# Patient Record
Sex: Female | Born: 1995 | Race: Black or African American | Hispanic: No | Marital: Single | Smoking: Current every day smoker
Health system: Southern US, Community
[De-identification: ages and names within clinical notes are randomized; demographics above are authoritative.]

---

## 2018-07-07 ENCOUNTER — Other Ambulatory Visit: Payer: Self-pay

## 2018-07-07 ENCOUNTER — Encounter (HOSPITAL_COMMUNITY): Payer: Self-pay | Admitting: Emergency Medicine

## 2018-07-07 ENCOUNTER — Emergency Department (HOSPITAL_COMMUNITY)
Admission: EM | Admit: 2018-07-07 | Discharge: 2018-07-07 | Disposition: A | Discharge status: Home / Self Care | Payer: Self-pay | Attending: Emergency Medicine | Admitting: Emergency Medicine

## 2018-07-07 ENCOUNTER — Emergency Department (HOSPITAL_COMMUNITY): Payer: Self-pay

## 2018-07-07 DIAGNOSIS — Z20822 Contact with and (suspected) exposure to covid-19: Secondary | ICD-10-CM

## 2018-07-07 DIAGNOSIS — F17210 Nicotine dependence, cigarettes, uncomplicated: Secondary | ICD-10-CM | POA: Insufficient documentation

## 2018-07-07 DIAGNOSIS — N6320 Unspecified lump in the left breast, unspecified quadrant: Secondary | ICD-10-CM | POA: Insufficient documentation

## 2018-07-07 DIAGNOSIS — R112 Nausea with vomiting, unspecified: Secondary | ICD-10-CM | POA: Insufficient documentation

## 2018-07-07 DIAGNOSIS — Z20828 Contact with and (suspected) exposure to other viral communicable diseases: Secondary | ICD-10-CM | POA: Insufficient documentation

## 2018-07-07 DIAGNOSIS — N644 Mastodynia: Secondary | ICD-10-CM | POA: Insufficient documentation

## 2018-07-07 LAB — CBC WITH DIFFERENTIAL/PLATELET
Abs Immature Granulocytes: 0.01 10*3/uL (ref 0.00–0.07)
Basophils Absolute: 0 10*3/uL (ref 0.0–0.1)
Basophils Relative: 1 %
Eosinophils Absolute: 0.1 10*3/uL (ref 0.0–0.5)
Eosinophils Relative: 1 %
HCT: 34 % — ABNORMAL LOW (ref 36.0–46.0)
Hemoglobin: 10.9 g/dL — ABNORMAL LOW (ref 12.0–15.0)
Immature Granulocytes: 0 %
Lymphocytes Relative: 41 %
Lymphs Abs: 2.7 10*3/uL (ref 0.7–4.0)
MCH: 30.6 pg (ref 26.0–34.0)
MCHC: 32.1 g/dL (ref 30.0–36.0)
MCV: 95.5 fL (ref 80.0–100.0)
Monocytes Absolute: 0.4 10*3/uL (ref 0.1–1.0)
Monocytes Relative: 6 %
Neutro Abs: 3.3 10*3/uL (ref 1.7–7.7)
Neutrophils Relative %: 51 %
Platelets: 384 10*3/uL (ref 150–400)
RBC: 3.56 MIL/uL — ABNORMAL LOW (ref 3.87–5.11)
RDW: 12.2 % (ref 11.5–15.5)
WBC: 6.6 10*3/uL (ref 4.0–10.5)
nRBC: 0 % (ref 0.0–0.2)

## 2018-07-07 LAB — COMPREHENSIVE METABOLIC PANEL
ALT: 19 U/L (ref 0–44)
AST: 21 U/L (ref 15–41)
Albumin: 4.1 g/dL (ref 3.5–5.0)
Alkaline Phosphatase: 56 U/L (ref 38–126)
Anion gap: 10 (ref 5–15)
BUN: 12 mg/dL (ref 6–20)
CO2: 24 mmol/L (ref 22–32)
Calcium: 9.1 mg/dL (ref 8.9–10.3)
Chloride: 103 mmol/L (ref 98–111)
Creatinine, Ser: 0.66 mg/dL (ref 0.44–1.00)
GFR calc Af Amer: >60 mL/min (ref >60)
GFR calc non Af Amer: >60 mL/min (ref >60)
Glucose, Bld: 88 mg/dL (ref 70–99)
Potassium: 3.8 mmol/L (ref 3.5–5.1)
Sodium: 137 mmol/L (ref 135–145)
Total Bilirubin: 0.4 mg/dL (ref 0.3–1.2)
Total Protein: 7.5 g/dL (ref 6.5–8.1)

## 2018-07-07 LAB — URINALYSIS, ROUTINE W REFLEX MICROSCOPIC
Bilirubin Urine: NEGATIVE
Glucose, UA: NEGATIVE mg/dL
Hgb urine dipstick: NEGATIVE
Ketones, ur: NEGATIVE mg/dL
Leukocytes,Ua: NEGATIVE
Nitrite: NEGATIVE
Protein, ur: NEGATIVE mg/dL
Specific Gravity, Urine: 1.018 (ref 1.005–1.030)
pH: 7 (ref 5.0–8.0)

## 2018-07-07 LAB — I-STAT BETA HCG BLOOD, ED (MC, WL, AP ONLY): I-stat hCG, quantitative: <5 m[IU]/mL (ref <5)

## 2018-07-07 LAB — LIPASE, BLOOD: Lipase: 21 U/L (ref 11–51)

## 2018-07-07 MED ORDER — SODIUM CHLORIDE 0.9 % IV BOLUS
1000.0000 mL | Freq: Once | INTRAVENOUS | Status: AC
  Administered 2018-07-07: 1000 mL via INTRAVENOUS

## 2018-07-07 MED ORDER — ONDANSETRON HCL 4 MG/2ML IJ SOLN
4.0000 mg | Freq: Once | INTRAMUSCULAR | Status: AC
  Administered 2018-07-07: 14:00:00 4 mg via INTRAVENOUS
  Filled 2018-07-07: qty 2

## 2018-07-07 MED ORDER — FAMOTIDINE 20 MG PO TABS: 20.0000 mg | ORAL_TABLET | Freq: Two times a day (BID) | ORAL | 0 refills | Status: AC

## 2018-07-07 MED ORDER — KETOROLAC TROMETHAMINE 30 MG/ML IJ SOLN
30.0000 mg | Freq: Once | INTRAMUSCULAR | Status: AC
  Administered 2018-07-07: 14:00:00 30 mg via INTRAVENOUS
  Filled 2018-07-07: qty 1

## 2018-07-07 MED ORDER — ONDANSETRON 4 MG PO TBDP: ORAL_TABLET | ORAL | 0 refills | Status: AC

## 2018-07-07 NOTE — ED Notes (Signed)
Discharge instructions read to pt who verbalized understanding. Pt calm and cooperative.  All belongings returned to pt. Escorted pt to the exit and she called a cab.

## 2018-07-07 NOTE — ED Provider Notes (Signed)
[facility]-EMERGENCY DEPT Provider Note   CSN: [id number] Arrival date & time: 07/07/18  1006    History   Chief Complaint Chief Complaint  Patient presents with   Emesis    HPI Paige Rush Grullon is a 23 y.o. female.     Paige Rush Paige Rush is a 23 y.o. female who is otherwise healthy, presents to emergency department for evaluation of nausea and vomiting for 2 days.  She reports some associated generalized body aches and thinks she had a low-grade fever at work today.  Has had some diarrhea.  An occasional cough.  Patient also complains of breast tenderness.  She denies chest pain or shortness of breath.  She denies any lumps or bumps noted on the left breast, no nipple changes or discharge.  She denies abdominal pain associated with her nausea and vomiting but reports she feels achy all over, no dysuria or urinary frequency, no vaginal discharge or vaginal bleeding.  Patient reports that her period was little over a month ago, her cycle is often irregular, she is sexually active and does not use protection, wonders if she could be pregnant.  She has not taken any medications to treat her symptoms prior to arrival, was eating in the waiting room and has been able to keep this down, no other aggravating relieving factors.     History reviewed. No pertinent past medical history.  There are no active problems to display for this patient.   History reviewed. No pertinent surgical history.   OB History   No obstetric history on file.      Home Medications    Prior to Admission medications   Medication Sig Start Date End Date Taking? Authorizing Provider  famotidine (PEPCID) 20 MG tablet Take 1 tablet (20 mg total) by mouth 2 (two) times daily. 07/07/18   Dartha LodgeFord, Zayyan Mullen N, PA-C  ondansetron (ZOFRAN ODT) 4 MG disintegrating tablet 4mg  ODT q4 hours prn nausea/vomit 07/07/18   Dartha LodgeFord, Belladonna Lubinski N, PA-C    Family History No family history on file.  Social History Social  History   Tobacco Use   Smoking status: Current Every Day Smoker    Packs/day: 0.50    Types: Cigarettes  Substance Use Topics   Alcohol use: Never    Frequency: Never   Drug use: Yes    Types: Marijuana     Allergies   Patient has no known allergies.   Review of Systems Review of Systems  Constitutional: Positive for chills. Negative for fever.  HENT: Negative.   Respiratory: Positive for cough. Negative for chest tightness and shortness of breath.   Cardiovascular: Negative for chest pain and leg swelling.  Gastrointestinal: Positive for diarrhea, nausea and vomiting. Negative for abdominal pain and constipation.  Genitourinary: Negative for dysuria, frequency, vaginal bleeding and vaginal discharge.  Musculoskeletal: Positive for myalgias. Negative for arthralgias.  Skin: Negative for color change and rash.     Physical Exam Updated Vital Signs BP 119/75 (BP Location: Left Arm)    Pulse 60    Temp 98.9 F (37.2 C) (Oral)    Resp 15    Ht 5\' 7"  (1.702 m)    Wt 81.6 kg    LMP 05/26/2018 (Approximate)    SpO2 97%    BMI 28.19 kg/m   Physical Exam Vitals signs and nursing note reviewed.  Constitutional:      General: She is not in acute distress.    Appearance: Normal appearance. She is well-developed and normal weight. She  is not ill-appearing or diaphoretic.     Comments: Patient is well-appearing, laying comfortably in bed.  HENT:     Head: Normocephalic and atraumatic.     Mouth/Throat:     Mouth: Mucous membranes are moist.     Pharynx: Oropharynx is clear.  Eyes:     General:        Right eye: No discharge.        Left eye: No discharge.     Pupils: Pupils are equal, round, and reactive to light.  Neck:     Musculoskeletal: Neck supple.  Cardiovascular:     Rate and Rhythm: Normal rate and regular rhythm.     Heart sounds: Normal heart sounds. No murmur. No friction rub. No gallop.   Pulmonary:     Effort: Pulmonary effort is normal. No respiratory  distress.     Breath sounds: Normal breath sounds. No wheezing or rales.     Comments: Respirations equal and unlabored, patient able to speak in full sentences, lungs clear to auscultation bilaterally Chest:       Comments: Aside from cystic structure noted above, no palpable masses, no overlying skin changes, erythema or warmth, no nipple discharge. Abdominal:     General: Bowel sounds are normal. There is no distension.     Palpations: Abdomen is soft. There is no mass.     Tenderness: There is no abdominal tenderness. There is no guarding.     Comments: Abdomen soft, nondistended, nontender to palpation in all quadrants without guarding or peritoneal signs  Musculoskeletal:        General: No deformity.  Skin:    General: Skin is warm and dry.     Capillary Refill: Capillary refill takes less than 2 seconds.  Neurological:     Mental Status: She is alert.     Coordination: Coordination normal.     Comments: Speech is clear, able to follow commands Moves extremities without ataxia, coordination intact  Psychiatric:        Mood and Affect: Mood normal.        Behavior: Behavior normal.      ED Treatments / Results  Labs (all labs ordered are listed, but only abnormal results are displayed) Labs Reviewed  CBC WITH DIFFERENTIAL/PLATELET - Abnormal; Notable for the following components:      Result Value   RBC 3.56 (*)    Hemoglobin 10.9 (*)    HCT 34.0 (*)    All other components within normal limits  NOVEL CORONAVIRUS, NAA (HOSPITAL ORDER, SEND-OUT TO REF LAB)  COMPREHENSIVE METABOLIC PANEL  LIPASE, BLOOD  URINALYSIS, ROUTINE W REFLEX MICROSCOPIC  I-STAT BETA HCG BLOOD, ED ([facility], [facility], [facility] ONLY)    EKG EKG Interpretation  Date/Time:  Wednesday July 07 2018 13:46:57 EDT Ventricular Rate:  71 PR Interval:    QRS Duration: 90 QT Interval:  426 QTC Calculation: 463 R Axis:   74 Text Interpretation:  Sinus rhythm No prior for comparison.  No STEMI  Confirmed by Nanda Quinton [id number]) on 07/07/2018 2:17:27 PM   Radiology Dg Chest Port 1 View  Result Date: 07/07/2018 CLINICAL DATA:  Weakness and confusion. EXAM: PORTABLE CHEST 1 VIEW COMPARISON:  None. FINDINGS: 1503 hours. Two views obtained. The heart size and mediastinal contours are normal. The lungs are clear. There is no pleural effusion or pneumothorax. No acute osseous findings are identified. Telemetry leads overlie the chest. IMPRESSION: No active cardiopulmonary process. Electronically Signed   By: Richardean Sale  M.D.   On: 07/07/2018 16:08    Procedures Procedures (including critical care time)  Medications Ordered in ED Medications  sodium chloride 0.9 % bolus 1,000 mL (0 mLs Intravenous Stopped 07/07/18 1713)  ondansetron (ZOFRAN) injection 4 mg (4 mg Intravenous Given 07/07/18 1339)  ketorolac (TORADOL) 30 MG/ML injection 30 mg (30 mg Intravenous Given 07/07/18 1341)     Initial Impression / Assessment and Plan / ED Course  I have reviewed the triage vital signs and the nursing notes.  Pertinent labs & imaging results that were available during my care of the patient were reviewed by me and considered in my medical decision making (see chart for details).  Patient presents for evaluation of nausea, vomiting, generalized body aches and breast tenderness.  She felt like she had a low-grade fever at work today.  Unsure of sick contacts.  No focal abdominal pain on exam.  Patient complaining of breast tenderness, small palpable cystic structure within the left breast, no other palpable masses, no skin changes or signs of abscess or mastitis.  Will have patient follow-up at the breast center for imaging.  Remainder of patient's exam is reassuring, lungs are clear, she has not had any active vomiting and she appears well.  Will check basic labs, given low-grade fever, patient simply requested that she be tested for the coronavirus, I feel this is reasonable, will send 48-hour test.  We will also check  chest x-ray and EKG and breast and chest tenderness.  Labs very reassuring negative pregnancy, no leukocytosis, hemoglobin of 10.9, no acute electrolyte derangements, normal renal liver function, normal lipase.  Urinalysis without any signs of infection.  Chest x-ray is clear with no evidence of infiltrate or other acute abnormality.  EKG shows normal sinus rhythm without concerning changes.  Patient has been treated with Zofran and Toradol and IV fluids, she is tolerating p.o. with no further vomiting, suspect viral bug as cause for patient's symptoms, currently test pending, discussed quarantine at home until results have returned.  Patient expresses understanding and agreement with plan.  Discharged home in good condition.  Final Clinical Impressions(s) / ED Diagnoses   Final diagnoses:  Non-intractable vomiting with nausea, unspecified vomiting type  Breast tenderness  Suspected Covid-19 Virus Infection  Breast mass, left    ED Discharge Orders         Ordered    ondansetron (ZOFRAN ODT) 4 MG disintegrating tablet     07/07/18 1657    famotidine (PEPCID) 20 MG tablet  2 times daily     07/07/18 1657           Dartha LodgeFord, Esker Dever N, [place]PA-C 07/10/18 1128    Maia PlanLong, Joshua G, MD 07/12/18 [phone]

## 2018-07-07 NOTE — ED Notes (Signed)
Bed: WA07 Expected date:  Expected time:  Means of arrival:  Comments: 

## 2018-07-07 NOTE — ED Notes (Signed)
Bed: WTR9 Expected date:  Expected time:  Means of arrival:  Comments: 

## 2018-07-07 NOTE — ED Notes (Signed)
RN was attempting to finish triaging patient and she demanded to speak to this RN's director. Pt is calling staff "b words" and asking again to speak to the director. ED ADON made aware of patient request. Unable to finish triage due to patient's demands

## 2018-07-07 NOTE — ED Triage Notes (Signed)
Pt reports nausea and vomiting for 2 days.  Generalized body ache.  Per report she ate some cookies in the waiting room without vomiting.  She had some Tylenol from her employer prior to arrival. It has not helped her body ache.

## 2018-07-07 NOTE — ED Notes (Signed)
EKG given to Dr. Butler 

## 2018-07-07 NOTE — Discharge Instructions (Addendum)
Your work-up today has been very reassuring, pregnancy test was negative and labs overall look good, chest x-ray was clear.  You were tested for the coronavirus today and you will be called in about 2 days with the results of your test, if negative you can return to work.  Your symptoms may also be caused by foodborne illness, use Zofran as needed for nausea and Pepcid once daily before breakfast to help settle your stomach.  Make sure you are getting plenty of fluids.

## 2018-07-07 NOTE — ED Notes (Signed)
Pt is upset in the lobby because pt sts " its taking to long and I am in pain. You are only taking white people back and you are making me wait just because I am black." pt advised of the triage process, but pt is still upset.

## 2018-07-07 NOTE — ED Notes (Signed)
This Probation officer was asked to speak to patient regarding behavior and patient concerns.  Pt upset that she had been "skipped over" by EMT while she was on the phone and perceived EMT's behavior as rude.  This Probation officer explained the triage process and promised to speak to staff.  Pt was very understanding and her behavior was appropriate with this Probation officer.

## 2018-07-08 LAB — NOVEL CORONAVIRUS, NAA (HOSP ORDER, SEND-OUT TO REF LAB; TAT 18-24 HRS): SARS-CoV-2, NAA: NOT DETECTED

## 2020-02-23 IMAGING — DX PORTABLE CHEST - 1 VIEW
1 series · 2 of 2 positions shown · non-contrast
Comparison: None.

CLINICAL DATA: Weakness and confusion.

EXAM:
PORTABLE CHEST 1 VIEW

[Series 1: chest ap · 0.14mm/px · 2 of 2 slices shown]
[im 1/2]
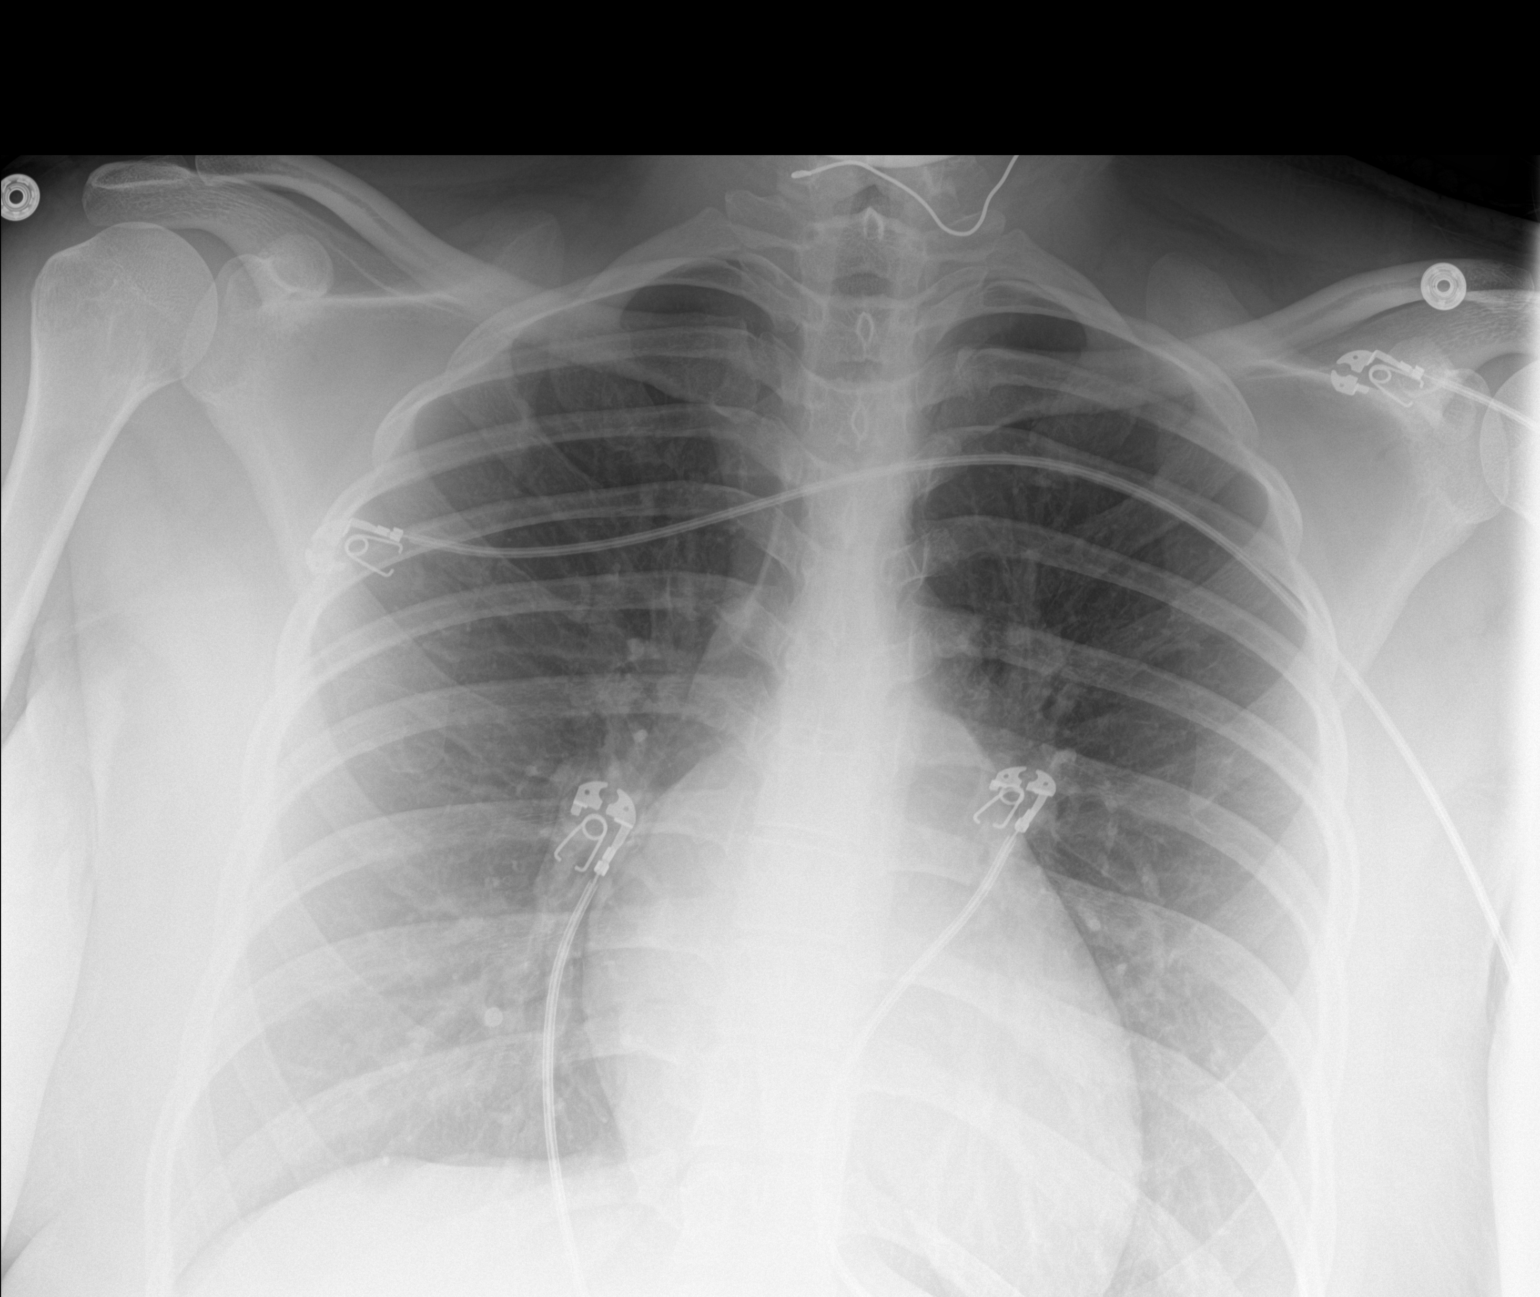
[im 2/2]
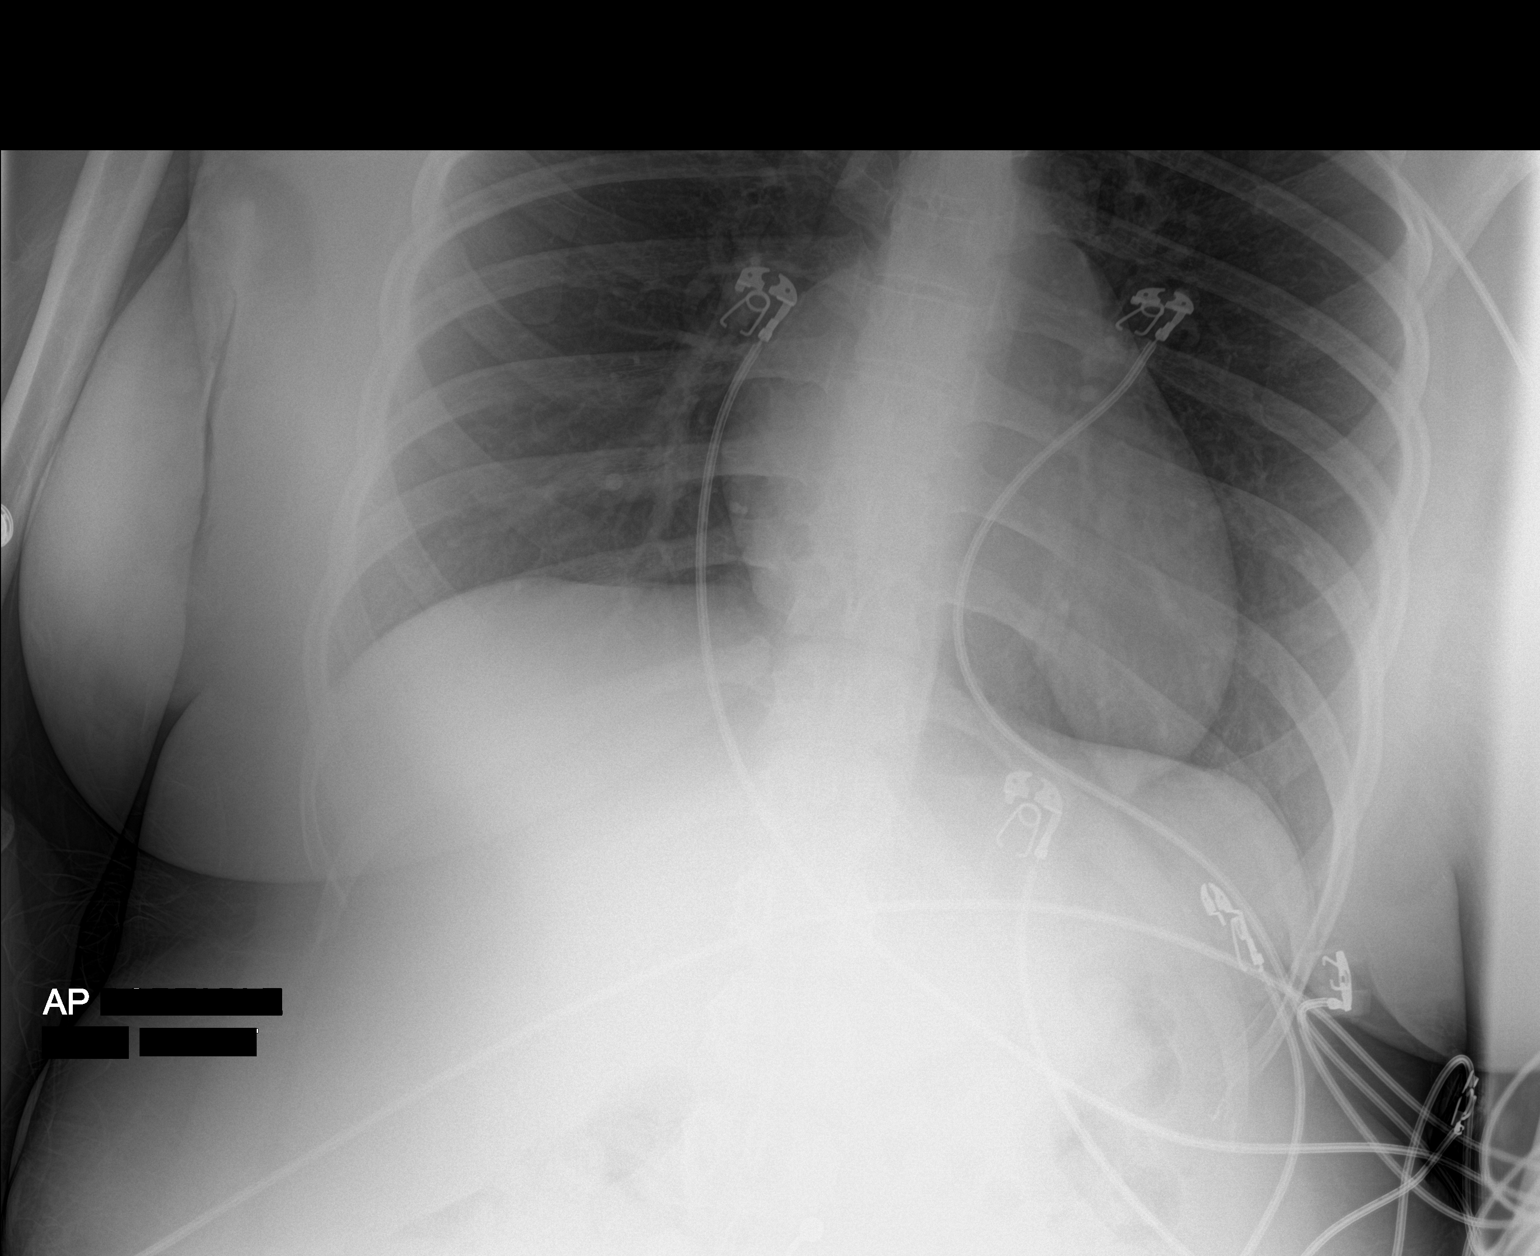

[2 of 2 positions shown; findings below may reference images not displayed]

FINDINGS: 1554 hours. Two views obtained. The heart size and mediastinal
contours are normal. The lungs are clear. There is no pleural
effusion or pneumothorax. No acute osseous findings are identified.
Telemetry leads overlie the chest.
IMPRESSION: No active cardiopulmonary process.

## 2022-05-27 ENCOUNTER — Emergency Department (HOSPITAL_COMMUNITY): Payer: Self-pay

## 2022-05-27 ENCOUNTER — Emergency Department (HOSPITAL_COMMUNITY)
Admission: EM | Admit: 2022-05-27 | Discharge: 2022-05-27 | Disposition: A | Discharge status: Home / Self Care | Payer: Self-pay | Attending: Emergency Medicine | Admitting: Emergency Medicine

## 2022-05-27 ENCOUNTER — Encounter (HOSPITAL_COMMUNITY): Payer: Self-pay

## 2022-05-27 DIAGNOSIS — Z3A00 Weeks of gestation of pregnancy not specified: Secondary | ICD-10-CM | POA: Insufficient documentation

## 2022-05-27 DIAGNOSIS — O4690 Antepartum hemorrhage, unspecified, unspecified trimester: Secondary | ICD-10-CM

## 2022-05-27 DIAGNOSIS — O209 Hemorrhage in early pregnancy, unspecified: Secondary | ICD-10-CM | POA: Insufficient documentation

## 2022-05-27 LAB — CBC WITH DIFFERENTIAL/PLATELET
Abs Immature Granulocytes: 0.01 10*3/uL (ref 0.00–0.07)
Basophils Absolute: 0.1 10*3/uL (ref 0.0–0.1)
Basophils Relative: 1 %
Eosinophils Absolute: 0.1 10*3/uL (ref 0.0–0.5)
Eosinophils Relative: 1 %
HCT: 34.8 % — ABNORMAL LOW (ref 36.0–46.0)
Hemoglobin: 11.3 g/dL — ABNORMAL LOW (ref 12.0–15.0)
Immature Granulocytes: 0 %
Lymphocytes Relative: 41 %
Lymphs Abs: 2.2 10*3/uL (ref 0.7–4.0)
MCH: 29.3 pg (ref 26.0–34.0)
MCHC: 32.5 g/dL (ref 30.0–36.0)
MCV: 90.2 fL (ref 80.0–100.0)
Monocytes Absolute: 0.2 10*3/uL (ref 0.1–1.0)
Monocytes Relative: 4 %
Neutro Abs: 2.9 10*3/uL (ref 1.7–7.7)
Neutrophils Relative %: 53 %
Platelets: 349 10*3/uL (ref 150–400)
RBC: 3.86 MIL/uL — ABNORMAL LOW (ref 3.87–5.11)
RDW: 13.7 % (ref 11.5–15.5)
WBC: 5.4 10*3/uL (ref 4.0–10.5)
nRBC: 0 % (ref 0.0–0.2)

## 2022-05-27 LAB — URINALYSIS, ROUTINE W REFLEX MICROSCOPIC
Bilirubin Urine: NEGATIVE
Glucose, UA: NEGATIVE mg/dL
Ketones, ur: NEGATIVE mg/dL
Leukocytes,Ua: NEGATIVE
Nitrite: NEGATIVE
Protein, ur: 30 mg/dL — AB
Specific Gravity, Urine: 1.032 — ABNORMAL HIGH (ref 1.005–1.030)
pH: 5 (ref 5.0–8.0)

## 2022-05-27 LAB — HCG, QUANTITATIVE, PREGNANCY: hCG, Beta Chain, Quant, S: 227 m[IU]/mL — ABNORMAL HIGH (ref <5)

## 2022-05-27 LAB — BASIC METABOLIC PANEL
Anion gap: 7 (ref 5–15)
BUN: 9 mg/dL (ref 6–20)
CO2: 23 mmol/L (ref 22–32)
Calcium: 9.1 mg/dL (ref 8.9–10.3)
Chloride: 105 mmol/L (ref 98–111)
Creatinine, Ser: 0.83 mg/dL (ref 0.44–1.00)
GFR, Estimated: >60 mL/min (ref >60)
Glucose, Bld: 99 mg/dL (ref 70–99)
Potassium: 3.5 mmol/L (ref 3.5–5.1)
Sodium: 135 mmol/L (ref 135–145)

## 2022-05-27 LAB — PREGNANCY, URINE: Preg Test, Ur: POSITIVE — AB

## 2022-05-27 MED ORDER — ACETAMINOPHEN 500 MG PO TABS
1000.0000 mg | ORAL_TABLET | Freq: Once | ORAL | Status: AC
  Administered 2022-05-27: 1000 mg via ORAL
  Filled 2022-05-27: qty 2

## 2022-05-27 MED ORDER — OXYCODONE HCL 5 MG PO TABS
5.0000 mg | ORAL_TABLET | Freq: Once | ORAL | Status: AC
  Administered 2022-05-27: 5 mg via ORAL
  Filled 2022-05-27: qty 1

## 2022-05-27 MED ORDER — LORAZEPAM 1 MG PO TABS
1.0000 mg | ORAL_TABLET | Freq: Once | ORAL | Status: AC
  Administered 2022-05-27: 1 mg via ORAL
  Filled 2022-05-27: qty 1

## 2022-05-27 NOTE — ED Notes (Signed)
Called lab to add HCG. MD states pt does not need to wait for results

## 2022-05-27 NOTE — ED Triage Notes (Signed)
C/o what she thought was heavy period for one week. 5 pads within 2 ours. Admits to brown and bright red blood. N/V last week mild discomfort. Admits She Google her symptoms and said come to ER". Admits she had miscarriage last year.

## 2022-05-27 NOTE — Discharge Instructions (Signed)
The ultrasound did not show any obvious viable pregnancy.  Please follow-up with OB/GYN in the office.  Typically if we do not see an obvious intrauterine pregnancy we have you return in 48 hours for recheck.

## 2022-05-27 NOTE — ED Provider Notes (Signed)
EMERGENCY DEPARTMENT AT [facility] Provider Note   CSN: [id number] Arrival date & time: 05/27/22  5956     History  Chief Complaint  Patient presents with   Vaginal Bleeding    Paige Rush is a 27 y.o. female.  27 yo F with a chief complaints of vaginal bleeding.  Started with less than her normal menstrual cycle was more like spotting going on for a couple weeks and then started having more heavy bleeding over the past couple days.  She has had some cramping that is consistent with her prior menstrual cycles.  She had some diffuse abdominal comfort and has vomited a couple times.  She denies any fevers.  Denies urinary symptoms.  She miscarried last year at 37 weeks.  Otherwise denies prior pregnancy.  She is sexually active and does not use protection and so thinks that she could be pregnant.   Vaginal Bleeding      Home Medications Prior to Admission medications   Medication Sig Start Date End Date Taking? Authorizing Provider  famotidine (PEPCID) 20 MG tablet Take 1 tablet (20 mg total) by mouth 2 (two) times daily. 07/07/18   Dartha Lodge, PA-C  ondansetron (ZOFRAN ODT) 4 MG disintegrating tablet 4mg  ODT q4 hours prn nausea/vomit 07/07/18   Dartha Lodge, PA-C      Allergies    Patient has no known allergies.    Review of Systems   Review of Systems  Genitourinary:  Positive for vaginal bleeding.    Physical Exam Updated Vital Signs BP (!) 136/49 (BP Location: Right Arm)   Pulse 84   Temp 98.3 F (36.8 C) (Oral)   Resp 19   LMP 04/14/2022 (Approximate)   SpO2 100%  Physical Exam Vitals and nursing note reviewed.  Constitutional:      General: She is not in acute distress.    Appearance: She is well-developed. She is not diaphoretic.  HENT:     Head: Normocephalic and atraumatic.  Eyes:     Pupils: Pupils are equal, round, and reactive to light.  Cardiovascular:     Rate and Rhythm: Normal rate and regular rhythm.     Heart  sounds: No murmur heard.    No friction rub. No gallop.  Pulmonary:     Effort: Pulmonary effort is normal.     Breath sounds: No wheezing or rales.  Abdominal:     General: There is no distension.     Palpations: Abdomen is soft.     Tenderness: There is abdominal tenderness.     Comments: Mild diffuse abdominal discomfort.   Musculoskeletal:        General: No tenderness.     Cervical back: Normal range of motion and neck supple.  Skin:    General: Skin is warm and dry.  Neurological:     Mental Status: She is alert and oriented to person, place, and time.  Psychiatric:        Behavior: Behavior normal.     ED Results / Procedures / Treatments   Labs (all labs ordered are listed, but only abnormal results are displayed) Labs Reviewed  URINALYSIS, ROUTINE W REFLEX MICROSCOPIC - Abnormal; Notable for the following components:      Result Value   Specific Gravity, Urine 1.032 (*)    Hgb urine dipstick SMALL (*)    Protein, ur 30 (*)    Bacteria, UA RARE (*)    All other components within normal limits  PREGNANCY, URINE - Abnormal; Notable for the following components:   Preg Test, Ur POSITIVE (*)    All other components within normal limits  CBC WITH DIFFERENTIAL/PLATELET - Abnormal; Notable for the following components:   RBC 3.86 (*)    Hemoglobin 11.3 (*)    HCT 34.8 (*)    All other components within normal limits  BASIC METABOLIC PANEL  HCG, QUANTITATIVE, PREGNANCY  POC URINE PREG, ED    EKG None  Radiology US OB Comp < 14 Wks  Result Date: 05/27/2022 CLINICAL DATA:  Possible ectopic pregnancy.  Vaginal bleeding. EXAM: OBSTETRIC <14 WK [place] AND TRANSVAGINAL OB US TECHNIQUE: Both transabdominal and transvaginal ultrasound examinations were performed for complete evaluation of the gestation as well as the maternal uterus, adnexal regions, and pelvic cul-de-sac. Transvaginal technique was performed to assess early pregnancy. COMPARISON:  None Available. FINDINGS:  Intrauterine gestational sac: None Maternal uterus/adnexae: Endometrial thickness of 8 mm. Unremarkable appearance of the ovaries aside from a corpus luteum cyst on the left. No adnexal mass identified. Small volume pelvic free fluid. IMPRESSION: No intrauterine pregnancy or findings suspicious for ectopic pregnancy. Findings are consistent with pregnancy of unknown location and may reflect early intrauterine pregnancy not yet visualized sonographically, occult ectopic pregnancy, or failed pregnancy. Electronically Signed   By: Sebastian Ache M.D.   On: 05/27/2022 12:58    Procedures Procedures    Medications Ordered in ED Medications  acetaminophen (TYLENOL) tablet 1,000 mg (1,000 mg Oral Given 05/27/22 1148)  oxyCODONE (Oxy IR/ROXICODONE) immediate release tablet 5 mg (5 mg Oral Given 05/27/22 1148)  LORazepam (ATIVAN) tablet 1 mg (1 mg Oral Given 05/27/22 1148)    ED Course/ Medical Decision Making/ A&P                             Medical Decision Making Amount and/or Complexity of Data Reviewed Labs: ordered. Radiology: ordered.  Risk OTC drugs. Prescription drug management.   27 yo F with a chief complaint of vaginal bleeding.  Going on for a couple weeks.  Will obtain a pregnancy test UA.  Pregnancy test is positive.  UA negative for infection on my independent interpretation.  Pelvic ultrasound with no identified IUP.  No obvious ectopic pregnancy.  Will have her follow-up in 48 hours either here or at the MAU.  CBC without significant anemia.  No significant electrolyte abnormality.  1:36 PM:  I have discussed the diagnosis/risks/treatment options with the patient.  Evaluation and diagnostic testing in the emergency department does not suggest an emergent condition requiring admission or immediate intervention beyond what has been performed at this time.  They will follow up with OB. We also discussed returning to the ED immediately if new or worsening sx occur. We discussed the sx  which are most concerning (e.g., sudden worsening pain, fever, inability to tolerate by mouth, worsening bleeding, syncope, worsening pain) that necessitate immediate return. Medications administered to the patient during their visit and any new prescriptions provided to the patient are listed below.  Medications given during this visit Medications  acetaminophen (TYLENOL) tablet 1,000 mg (1,000 mg Oral Given 05/27/22 1148)  oxyCODONE (Oxy IR/ROXICODONE) immediate release tablet 5 mg (5 mg Oral Given 05/27/22 1148)  LORazepam (ATIVAN) tablet 1 mg (1 mg Oral Given 05/27/22 1148)     The patient appears reasonably screen and/or stabilized for discharge and I doubt any other medical condition or other [facility] requiring further screening, evaluation, or  treatment in the ED at this time prior to discharge.          Final Clinical Impression(s) / ED Diagnoses Final diagnoses:  Vaginal bleeding in pregnancy    Rx / DC Orders ED Discharge Orders     None         Melene Plan, DO 05/27/22 1336

## 2022-05-30 ENCOUNTER — Inpatient Hospital Stay (HOSPITAL_COMMUNITY)
Admission: AD | Admit: 2022-05-30 | Discharge: 2022-05-30 | Discharge status: Against Medical Advice | Payer: Self-pay | Attending: Obstetrics and Gynecology | Admitting: Obstetrics and Gynecology

## 2022-05-30 ENCOUNTER — Encounter (HOSPITAL_COMMUNITY): Payer: Self-pay | Admitting: *Deleted

## 2022-05-30 DIAGNOSIS — O209 Hemorrhage in early pregnancy, unspecified: Secondary | ICD-10-CM | POA: Insufficient documentation

## 2022-05-30 DIAGNOSIS — Z5321 Procedure and treatment not carried out due to patient leaving prior to being seen by health care provider: Secondary | ICD-10-CM | POA: Insufficient documentation

## 2022-05-30 DIAGNOSIS — Z3A01 Less than 8 weeks gestation of pregnancy: Secondary | ICD-10-CM

## 2022-05-30 LAB — CBC
HCT: 34.5 % — ABNORMAL LOW (ref 36.0–46.0)
Hemoglobin: 11.3 g/dL — ABNORMAL LOW (ref 12.0–15.0)
MCH: 28.9 pg (ref 26.0–34.0)
MCHC: 32.8 g/dL (ref 30.0–36.0)
MCV: 88.2 fL (ref 80.0–100.0)
Platelets: 364 10*3/uL (ref 150–400)
RBC: 3.91 MIL/uL (ref 3.87–5.11)
RDW: 13.4 % (ref 11.5–15.5)
WBC: 5.2 10*3/uL (ref 4.0–10.5)
nRBC: 0 % (ref 0.0–0.2)

## 2022-05-30 LAB — ABO/RH: ABO/RH(D): O POS

## 2022-05-30 LAB — HCG, QUANTITATIVE, PREGNANCY: hCG, Beta Chain, Quant, S: 197 m[IU]/mL — ABNORMAL HIGH (ref <5)

## 2022-05-30 NOTE — MAU Note (Signed)
Pt tired of waiting stated she is going home and will see another doctor. Notified provider. Pt walked out before she could sign AMA form

## 2022-05-30 NOTE — MAU Provider Note (Signed)
History     CSN: [id number]  Arrival date and time: 05/30/22 1358   Event Date/Time   First Provider Initiated Contact with Patient 05/30/22 1523      Chief Complaint  Patient presents with   Vaginal Bleeding   HPI Paige Rush is a 27 y.o. G1P0 at [redacted]w[redacted]d by LMP. She presents to MAU with chief complaint of scant vaginal spotting on 05/27/2022. She voices to Olegario Messier, Consulting civil engineer and to [employer] that she is frustrated because she was seen in the ED for a pregnancy concern but did not receive an intelligible diagnosis and and was told to followup with MAU. She denies abdominal pain, heavy vaginal bleeding, dysuria, fever or recent illness.  OB History     Gravida  1   Para      Term      Preterm      AB      Living         SAB      IAB      Ectopic      Multiple      Live Births              No past medical history on file.  No past surgical history on file.  No family history on file.  Social History   Tobacco Use   Smoking status: Every Day    Packs/day: .5    Types: Cigarettes  Substance Use Topics   Alcohol use: Never   Drug use: Yes    Types: Marijuana    Allergies: No Known Allergies  Medications Prior to Admission  Medication Sig Dispense Refill Last Dose   famotidine (PEPCID) 20 MG tablet Take 1 tablet (20 mg total) by mouth 2 (two) times daily. 30 tablet 0    ondansetron (ZOFRAN ODT) 4 MG disintegrating tablet 4mg  ODT q4 hours prn nausea/vomit 10 tablet 0     Review of Systems  Genitourinary:  Positive for vaginal bleeding.  All other systems reviewed and are negative.  Physical Exam   Blood pressure 127/85, pulse 66, temperature 98.3 F (36.8 C), resp. rate 18, height 5\' 9"  (1.753 m), weight 77.6 kg, last menstrual period 04/14/2022.  Physical Exam Vitals and nursing note reviewed.  Constitutional:      General: She is not in acute distress.    Appearance: Normal appearance. She is not ill-appearing.  Cardiovascular:     Rate and  Rhythm: Normal rate.  Pulmonary:     Effort: Pulmonary effort is normal.  Abdominal:     General: Abdomen is flat.     Tenderness: There is no abdominal tenderness.  Neurological:     Mental Status: She is alert and oriented to person, place, and time.  Psychiatric:        Mood and Affect: Mood normal.        Behavior: Behavior normal.        Thought Content: Thought content normal.        Judgment: Judgment normal.     MAU Course  Procedures  MDM  --EMR reviewed. No IUP on ultrasound performed 05/27/2022  --Patient given option of waiting for results vs discharge home with CNM to call and discuss results.  --Patient elects to wait for results --1659: Quant hCG resulted, now with decreasing quant hCG --1704: notified by [facility] RN that patient had approached Dabney, Nurse Tech and verbalized her intention to leave, was seen walking to lobby --1706: notified by MAU registration that patient  was leaving due to hunger and visualized walking out of MAU  Orders Placed This Encounter  Procedures   CBC   hCG, quantitative, pregnancy   ABO/Rh   Patient Vitals for the past 24 hrs:  BP Temp Pulse Resp Height Weight  05/30/22 1519 127/85 98.3 F (36.8 C) 66 18 5\' 9"  (1.753 m) 77.6 kg   Results for orders placed or performed during the hospital encounter of 05/30/22 (from the past 24 hour(s))  CBC     Status: Abnormal   Collection Time: 05/30/22  3:39 PM  Result Value Ref Range   WBC 5.2 4.0 - 10.5 K/uL   RBC 3.91 3.87 - 5.11 MIL/uL   Hemoglobin 11.3 (L) 12.0 - 15.0 g/dL   HCT 30.8 (L) 65.7 - 84.6 %   MCV 88.2 80.0 - 100.0 fL   MCH 28.9 26.0 - 34.0 pg   MCHC 32.8 30.0 - 36.0 g/dL   RDW 96.2 95.2 - 84.1 %   Platelets 364 150 - 400 K/uL   nRBC 0.0 0.0 - 0.2 %  Result Value Ref Range   ABO/RH(D) O POS    No rh immune globuloin      NOT A RH IMMUNE GLOBULIN CANDIDATE, PT RH POSITIVE Performed at [facility], [address] [place], [place] [address]     Component     Latest Ref Rng 05/27/2022 05/30/2022  HCG, Beta Chain, Quant, S     <5 mIU/mL 227 (H)  197 (H)     Assessment and Plan  --Patient left AMA prior to discussion of next steps --Verbalized to MAU registration she plans to present to another ED for evaluation  Calvert Cantor, MSA, MSN, CNM 05/30/2022, 6:52 PM

## 2022-05-30 NOTE — MAU Note (Signed)
.  Paige Rush is a 27 y.o. at [redacted]w[redacted]d here in MAU reporting: went to ED on 5/14 for vag bleeding was told she was pregnant but only saw a sac. Was told to f/u in MAU. Stated she still is having a little spotting and mid to upper back pain. LMP: 04/14/22 Onset of complaint: 3-4 days Pain score: 5 Vitals:   05/30/22 1519  BP: 127/85  Pulse: 66  Resp: 18  Temp: 98.3 F (36.8 C)     FHT:n/a Lab orders placed from triage:
